# Patient Record
Sex: Male | Born: 1995 | Hispanic: No | Marital: Single
Health system: Southern US, Community
[De-identification: ages and names within clinical notes are randomized; demographics above are authoritative.]

---
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
## 2020-01-23 ENCOUNTER — Ambulatory Visit: Payer: Self-pay

## 2020-01-23 ENCOUNTER — Other Ambulatory Visit: Payer: Self-pay

## 2020-01-23 DIAGNOSIS — Z021 Encounter for pre-employment examination: Secondary | ICD-10-CM

## 2020-01-23 LAB — POCT URINE DRUG SCREEN
POC Amphetamine UR: NOT DETECTED
POC Cocaine UR: NOT DETECTED
POC Marijuana UR: NOT DETECTED
POC Methamphetamine UR: NOT DETECTED
POC Opiate Ur: NOT DETECTED
POC PHENCYCLIDINE UR: NOT DETECTED
URINE TEMPERATURE: 94 Degrees F (ref 90.0–100.0)

## 2020-01-23 NOTE — Progress Notes (Signed)
     Patient ID: Darrell Reynolds., male    DOB: 1996/08/29, 24 y.o.   MRN: [id number]    Thank you!!  Danise Edge RN  [facility]  Occupational Health Nurse Specialist [facility] Direct Dial: [phone]  Cell:  [phone] Website: [email or web address]

## 2022-09-25 ENCOUNTER — Ambulatory Visit: Payer: Self-pay

## 2024-09-07 ENCOUNTER — Ambulatory Visit: Payer: Self-pay

## 2024-09-07 DIAGNOSIS — Z113 Encounter for screening for infections with a predominantly sexual mode of transmission: Secondary | ICD-10-CM

## 2024-09-07 LAB — HM HIV SCREENING LAB: HM HIV Screening: NEGATIVE

## 2024-09-07 NOTE — Progress Notes (Signed)
 Pt is here STD screening. Condoms given, Wilkie Drought, RN.

## 2024-09-07 NOTE — Progress Notes (Signed)
 [facility] Department STI clinic [address], [address] [place] [address] Main phone: [phone]  STI screening visit  Subjective:  Darrell Reynolds. is a 28 y.o. male being seen today for an STI screening visit. The patient reports they do not have symptoms.    Patient has the following medical conditions:  There are no active problems to display for this patient.  Chief Complaint  Patient presents with   SEXUALLY TRANSMITTED DISEASE   HPI Patient reports desire for STI testing. No symptoms or contacts.  See flowsheet for further details and programmatic requirements  Hyperlink available at the top of the signed note in blue.  Flow sheet content below:  Pregnancy Intention Screening Does the patient want to become pregnant in the next year?: No Does the patient's partner want to become pregnant in the next year?: No Would the patient like to discuss contraceptive options today?: N/A Reason For STD Screen STD Screening: Is asymptomatic Have you ever had an STD?: Yes History of Antibiotic use in the past 2 weeks?: No STD Symptoms Denies all: Yes Risk Factors for Hep B Household, sexual, or needle sharing contact of a person infected with Hep B: No Sexual contact with a person who uses drugs not as prescribed?: No Currently or Ever used drugs not as prescribed: No HIV Positive: No PRep Patient: No Men who have sex with men: No Have Hepatitis C: No History of Incarceration: No History of Homeslessness?: No Anal sex following anal drug use?: No Risk Factors for Hep C Currently using drugs not as prescribed: No Sexual partner(s) currently using drugs as not prescribed: No History of drug use: No HIV Positive: No People with a history of incarceration: No People born between the years of 82 and 28: No Abuse History Has patient ever been abused physically?: No Has patient ever been abused sexually?: No Does patient feel they have  a problem with Anxiety?: No Does patient feel they have a problem with Depression?: No Counseling Patient counseled to use condoms with all sex: Condoms given RTC in 2-3 weeks for test results: Yes Clinic will call if test results abnormal before test result appt.: Yes Test results given to patient Patient counseled to use condoms with all sex: Condoms given  Screening for MPX risk: Does the patient have an unexplained rash? No Is the patient MSM? No Does the patient endorse multiple sex partners or anonymous sex partners? No Did the patient have close or sexual contact with a person diagnosed with MPX? No Has the patient traveled outside the [place]  where MPX is endemic? No Is there a high clinical suspicion for MPX-- evidenced by one of the following No  -Unlikely to be chickenpox  -Lymphadenopathy  -Rash that present in same phase of evolution on any given body part  STI screening history: Last HIV test per patient/review of record was No results found for: HMHIVSCREEN No results found for: HIV  Last HEPC test per patient/review of record was No results found for: HMHEPCSCREEN No components found for: HEPC   Last HEPB test per patient/review of record was No components found for: HMHEPBSCREEN   Fertility: Does the patient or their partner desires a pregnancy in the next year? No  Immunization History  Administered Date(s) Administered   Hepatitis A, Ped/Adol-2 Dose 11/02/2007   Hepatitis B, PED/ADOLESCENT 03/17/1999, 06/18/1999, 12/19/1999   MMR 09/18/1999, 08/01/2003   Pneumococcal Conjugate PCV 7 12/19/1999, 03/18/2000   Tdap 09/11/2009   Varicella 09/18/1999,  11/02/2007    The following portions of the patient's history were reviewed and updated as appropriate: allergies, current medications, past medical history, past social history, past surgical history and problem list.  Objective:  There were no vitals filed for this visit.  Physical Exam Constitutional:       Appearance: Normal appearance.  HENT:     Head: Normocephalic.     Mouth/Throat:     Lips: Pink. No lesions.     Mouth: Mucous membranes are moist.     Pharynx: Oropharynx is clear. No pharyngeal swelling or oropharyngeal exudate.     Tonsils: No tonsillar exudate.  Eyes:     General: No scleral icterus.       Right eye: No discharge.        Left eye: No discharge.  Pulmonary:     Effort: Pulmonary effort is normal.  Abdominal:     General: Abdomen is flat.  Lymphadenopathy:     Head:     Right side of head: No submental, submandibular, tonsillar, preauricular or posterior auricular adenopathy.     Left side of head: No submental, submandibular, tonsillar, preauricular or posterior auricular adenopathy.     Cervical: No cervical adenopathy.     Right cervical: No superficial or posterior cervical adenopathy.    Left cervical: No superficial or posterior cervical adenopathy.  Skin:    General: Skin is warm and dry.     Findings: No rash.  Neurological:     General: No focal deficit present.     Mental Status: He is alert.  Psychiatric:        Mood and Affect: Mood normal.        Behavior: Behavior normal.    Assessment and Plan:  Bo Teicher. is a 27 y.o. male presenting to the [facility] Department for STI screening  1. Screening for venereal disease (Primary)  - Chlamydia/GC NAA, Confirmation - Gonococcus culture - HIV [facility] - Syphilis Serology, [facility]   Patient does not have STI symptoms Patient accepted the following screenings: oral GC culture, urine CT/GC, HIV, and RPR Patient meets criteria for HepB screening? No. Ordered? no Patient meets criteria for HepC screening? No. Ordered? no Recommended condom use with all sex Discussed importance of condom use for STI prevention  Treat positive test results per standing order. Discussed time line for [facility] results and that patient will be called with positive results and  encouraged patient to call if he had not heard in 2 weeks Recommended repeat testing in 3 months with positive results. Recommended returning for continued or worsening symptoms.   Return for STI testing as needed.  No future appointments.  Damien FORBES Satchel, NP

## 2024-09-12 LAB — CHLAMYDIA/GC NAA, CONFIRMATION
Chlamydia trachomatis, NAA: NEGATIVE
Neisseria gonorrhoeae, NAA: NEGATIVE

## 2024-09-12 LAB — GONOCOCCUS CULTURE

## 2024-09-20 NOTE — Addendum Note (Signed)
 Addended by: Kaislee Chao on: 09/20/2024 10:11 AM   Modules accepted: Orders
